# Patient Record
Sex: Female | Born: 1975 | Race: White | Hispanic: No | Marital: Single | Smoking: Never smoker
Health system: Southern US, Community
[De-identification: ages and names within clinical notes are randomized; demographics above are authoritative.]

## PROBLEM LIST (undated history)

## (undated) DIAGNOSIS — Z87442 Personal history of urinary calculi: Secondary | ICD-10-CM

## (undated) HISTORY — PX: OTHER SURGICAL HISTORY: SHX169

## (undated) HISTORY — PX: TUBAL LIGATION: SHX77

---

## 2002-12-21 ENCOUNTER — Emergency Department (HOSPITAL_COMMUNITY): Admission: EM | Admit: 2002-12-21 | Discharge: 2002-12-21 | Payer: Self-pay | Admitting: Emergency Medicine

## 2003-03-17 ENCOUNTER — Emergency Department (HOSPITAL_COMMUNITY): Admission: EM | Admit: 2003-03-17 | Discharge: 2003-03-17 | Payer: Self-pay | Admitting: Emergency Medicine

## 2004-12-21 ENCOUNTER — Emergency Department: Payer: Self-pay | Admitting: Emergency Medicine

## 2004-12-22 ENCOUNTER — Ambulatory Visit: Payer: Self-pay | Admitting: Emergency Medicine

## 2004-12-22 ENCOUNTER — Emergency Department: Payer: Self-pay | Admitting: Emergency Medicine

## 2005-07-09 ENCOUNTER — Emergency Department: Payer: Self-pay | Admitting: Emergency Medicine

## 2006-07-10 ENCOUNTER — Emergency Department: Payer: Self-pay | Admitting: Emergency Medicine

## 2006-12-18 ENCOUNTER — Observation Stay: Payer: Self-pay | Admitting: Obstetrics and Gynecology

## 2006-12-21 ENCOUNTER — Inpatient Hospital Stay: Payer: Self-pay | Admitting: Obstetrics and Gynecology

## 2007-07-31 ENCOUNTER — Emergency Department: Payer: Self-pay | Admitting: Emergency Medicine

## 2007-07-31 ENCOUNTER — Emergency Department: Payer: Self-pay

## 2007-09-21 ENCOUNTER — Ambulatory Visit: Payer: Self-pay | Admitting: Obstetrics and Gynecology

## 2007-09-24 ENCOUNTER — Ambulatory Visit: Payer: Self-pay | Admitting: Obstetrics and Gynecology

## 2009-04-30 ENCOUNTER — Emergency Department: Payer: Self-pay | Admitting: Emergency Medicine

## 2009-07-01 ENCOUNTER — Emergency Department: Payer: Self-pay | Admitting: Unknown Physician Specialty

## 2010-02-14 ENCOUNTER — Emergency Department: Payer: Self-pay | Admitting: Emergency Medicine

## 2010-09-19 ENCOUNTER — Observation Stay: Payer: Self-pay | Admitting: Obstetrics and Gynecology

## 2010-09-21 ENCOUNTER — Inpatient Hospital Stay: Payer: Self-pay | Admitting: Obstetrics and Gynecology

## 2010-09-28 LAB — PATHOLOGY REPORT

## 2011-01-12 ENCOUNTER — Emergency Department: Payer: Self-pay | Admitting: Emergency Medicine

## 2011-06-05 ENCOUNTER — Emergency Department: Payer: Self-pay | Admitting: Emergency Medicine

## 2013-03-29 IMAGING — CT CT STONE STUDY
1 of 2 series · 15 of 32 positions shown, 19 images · non-contrast
Comparison: none

REASON FOR EXAM: L flank pain
COMMENTS:

PROCEDURE:     CT  - CT ABDOMEN /PELVIS WO (STONE)  - June 05, 2011  [DATE]
RESULT:     Comparison: None
TECHNIQUE: Multiple axial images from the lung bases to the symphysis pubis
were obtained without oral and without intravenous contrast.

[Series 2: stone · axial · 0.75mm/px · z∈[-501,-108]mm · 15 of 148 slices shown, 19 images]
[im 11/148  soft-tissue]
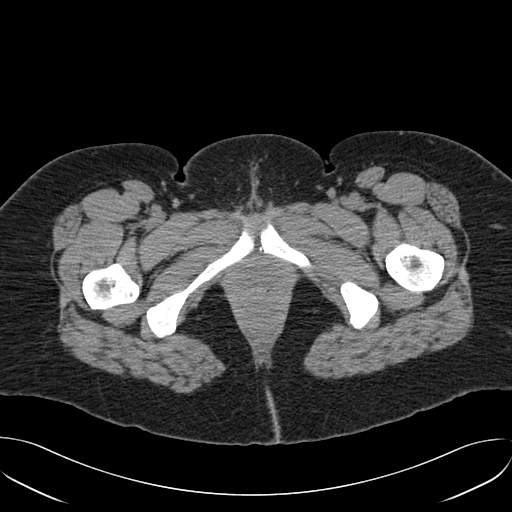
[im 11/148  bone]
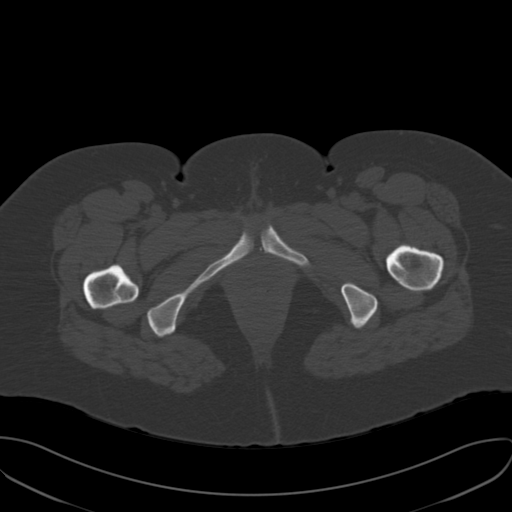
[im 22/148  soft-tissue]
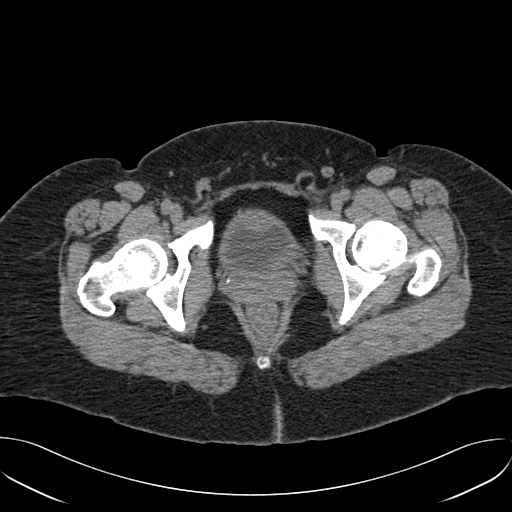
[im 33/148  soft-tissue]
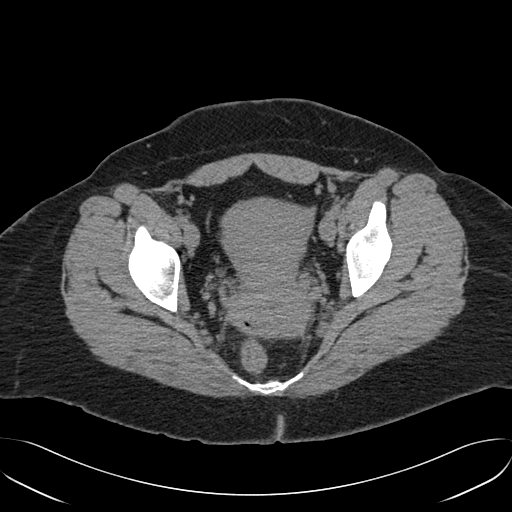
[im 44/148  soft-tissue]
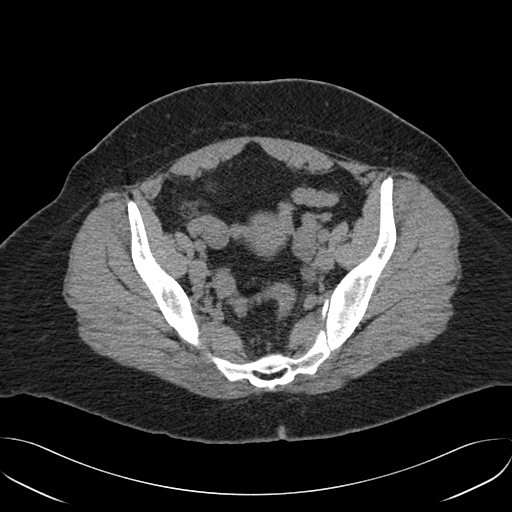
[im 55/148  soft-tissue]
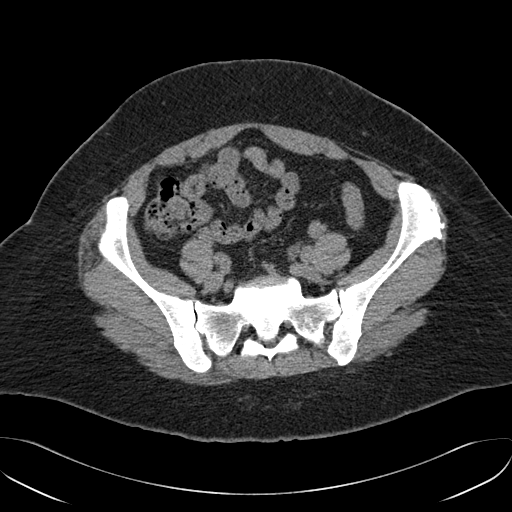
[im 66/148  soft-tissue]
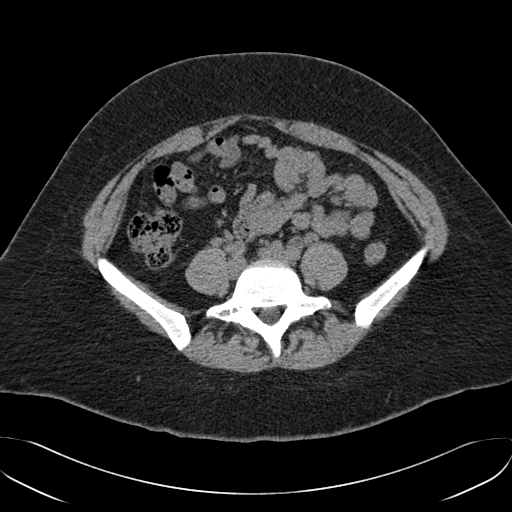
[im 77/148  soft-tissue]
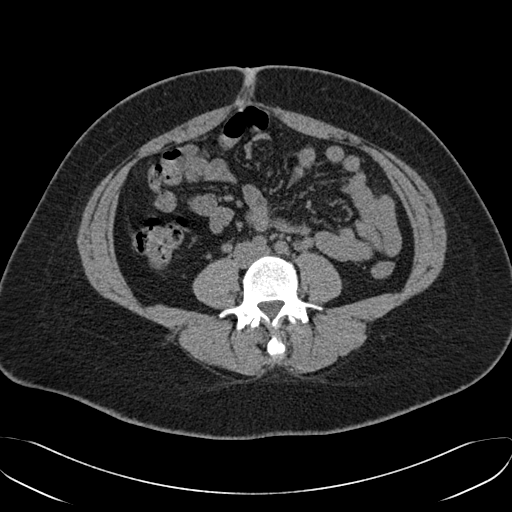
[im 88/148  soft-tissue]
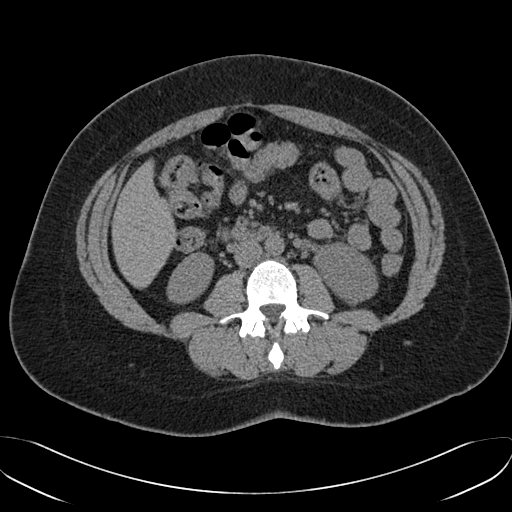
[im 99/148  soft-tissue]
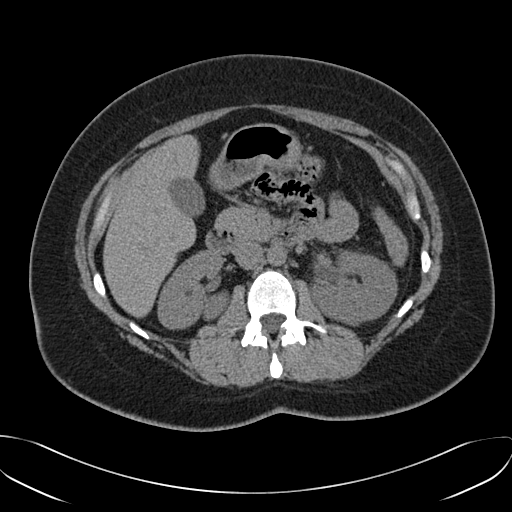
[im 99/148  bone]
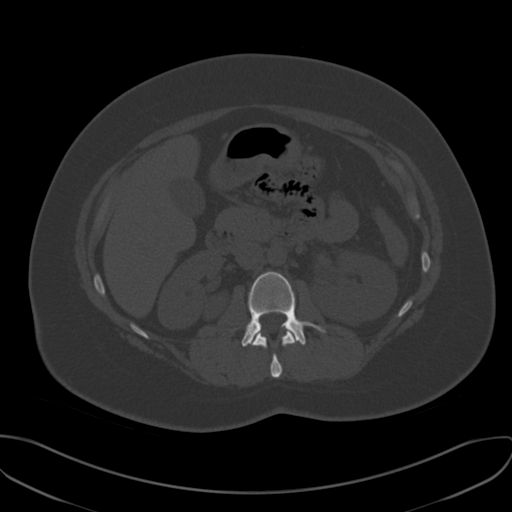
[im 109/148  soft-tissue]
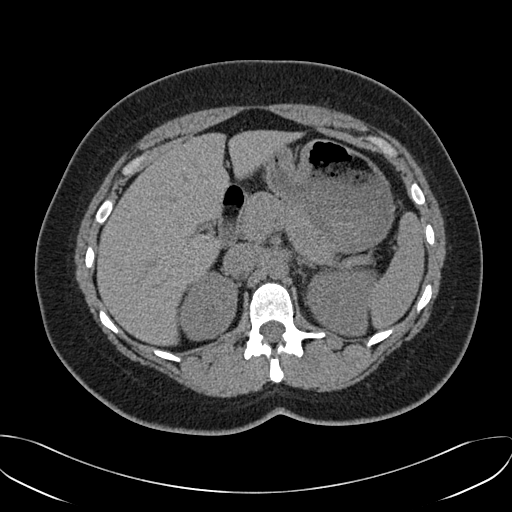
[im 120/148  soft-tissue]
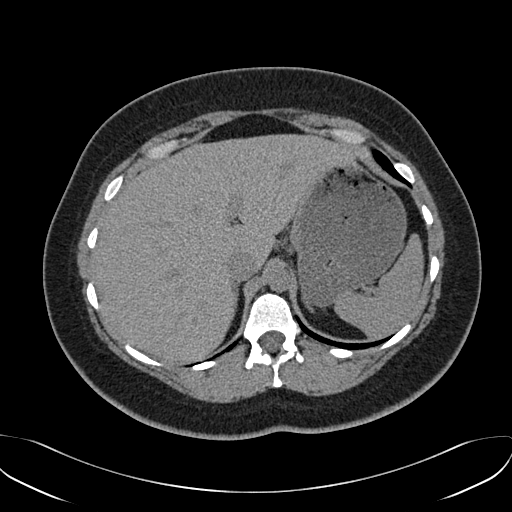
[im 126/148  lung]
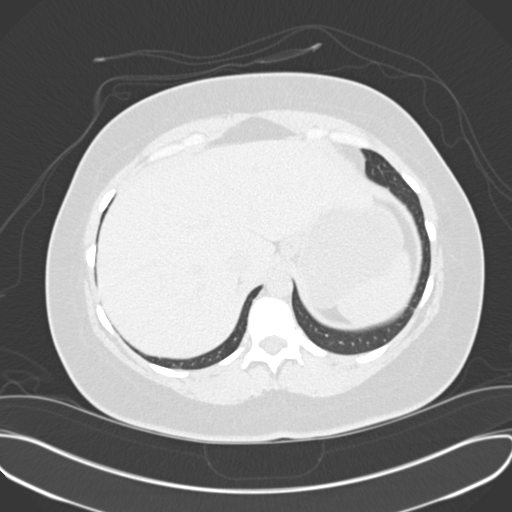
[im 131/148  soft-tissue]
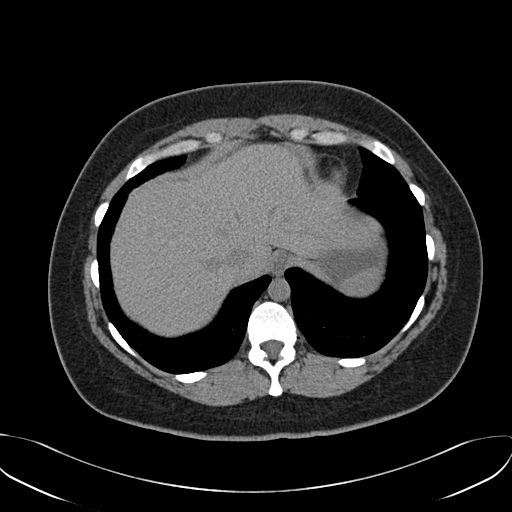
[im 131/148  lung]
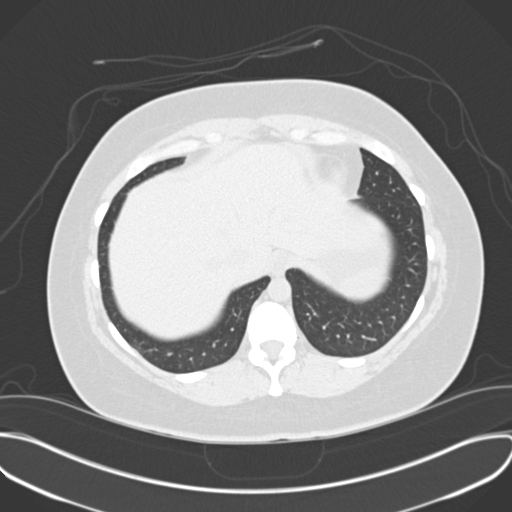
[im 137/148  lung]
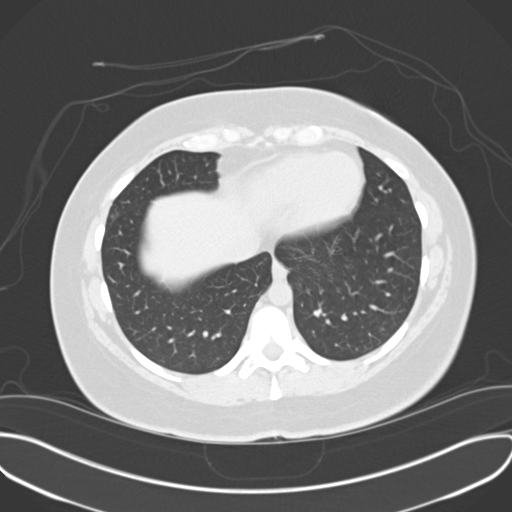
[im 142/148  soft-tissue]
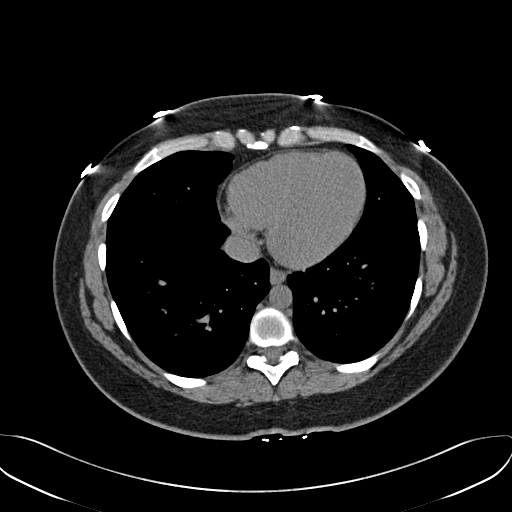
[im 142/148  lung]
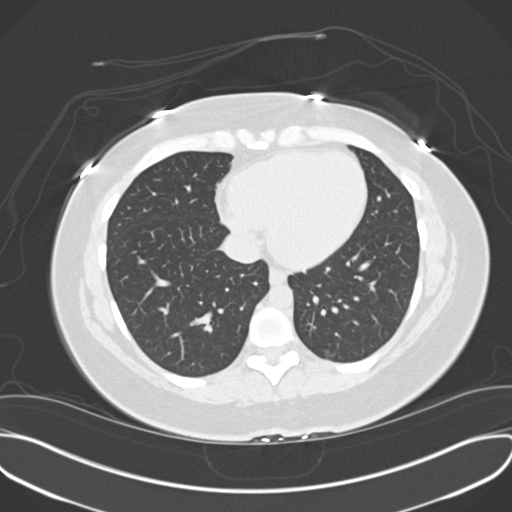

[15 of 32 positions shown; findings below may reference images not displayed]

FINDINGS: Lack of intravenous contrast limits evaluation of the solid abdominal
organs.  Grossly the liver, spleen, adrenals, gallbladder, and pancreas are
unremarkable.

There is a 2 mm calculus in the superior pole of the left kidney. There is
mild left perinephric stranding. There is mild dilatation of the left renal
collecting system and left ureter. This is seen to the level of the bladder.
There is mild thickening along the left base of the bladder which may be
related to edema from a recently passed stone. There are a few small
calcifications in the perineum inferior to the bladder, which are of
uncertain location.

The appendix is normal. The small and large bowel are normal in caliber.

No aggressive lytic or sclerotic osseous lesions identified.
IMPRESSION: 1. Mild dilatation of the left renal collecting system and left ureter
extending to the bladder. There is mild soft tissue thickening at the left
bladder base without a renal calculus. These findings may be secondary to a
recently passed stone or potentially infection. Correlate with clinical
history and urinalysis. If there is clinical concern for a mass at the
bladder base, dedicated multiphasic renal CT or direct visualization could
be performed.
2. Left-sided nephrolithiasis.

## 2016-04-02 ENCOUNTER — Emergency Department: Payer: No Typology Code available for payment source

## 2016-04-02 ENCOUNTER — Encounter: Payer: Self-pay | Admitting: Emergency Medicine

## 2016-04-02 ENCOUNTER — Emergency Department
Admission: EM | Admit: 2016-04-02 | Discharge: 2016-04-02 | Disposition: A | Discharge status: Home / Self Care | Payer: No Typology Code available for payment source | Attending: Student | Admitting: Student

## 2016-04-02 DIAGNOSIS — Y92410 Unspecified street and highway as the place of occurrence of the external cause: Secondary | ICD-10-CM | POA: Diagnosis not present

## 2016-04-02 DIAGNOSIS — S5012XA Contusion of left forearm, initial encounter: Secondary | ICD-10-CM

## 2016-04-02 DIAGNOSIS — S63502A Unspecified sprain of left wrist, initial encounter: Secondary | ICD-10-CM | POA: Diagnosis not present

## 2016-04-02 DIAGNOSIS — S46912A Strain of unspecified muscle, fascia and tendon at shoulder and upper arm level, left arm, initial encounter: Secondary | ICD-10-CM | POA: Insufficient documentation

## 2016-04-02 DIAGNOSIS — Y999 Unspecified external cause status: Secondary | ICD-10-CM | POA: Diagnosis not present

## 2016-04-02 DIAGNOSIS — Y9389 Activity, other specified: Secondary | ICD-10-CM | POA: Insufficient documentation

## 2016-04-02 DIAGNOSIS — M25512 Pain in left shoulder: Secondary | ICD-10-CM | POA: Diagnosis present

## 2016-04-02 MED ORDER — IBUPROFEN 600 MG PO TABS: 600.0000 mg | ORAL_TABLET | Freq: Three times a day (TID) | ORAL | Status: DC | PRN

## 2016-04-02 MED ORDER — OXYCODONE-ACETAMINOPHEN 7.5-325 MG PO TABS: 1.0000 | ORAL_TABLET | ORAL | Status: DC | PRN

## 2016-04-02 MED ORDER — OXYCODONE-ACETAMINOPHEN 5-325 MG PO TABS
1.0000 | ORAL_TABLET | Freq: Once | ORAL | Status: AC
  Administered 2016-04-02: 1 via ORAL
  Filled 2016-04-02: qty 1

## 2016-04-02 MED ORDER — IBUPROFEN 600 MG PO TABS
600.0000 mg | ORAL_TABLET | Freq: Once | ORAL | Status: AC
  Administered 2016-04-02: 600 mg via ORAL
  Filled 2016-04-02: qty 1
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
## 2016-04-02 NOTE — Discharge Instructions (Signed)
Wear arm sling for 2-3 days as needed. °
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
## 2018-07-18 ENCOUNTER — Encounter: Payer: Self-pay | Admitting: Emergency Medicine

## 2018-07-18 ENCOUNTER — Other Ambulatory Visit: Payer: Self-pay

## 2018-07-18 ENCOUNTER — Ambulatory Visit
Admission: EM | Admit: 2018-07-18 | Discharge: 2018-07-18 | Disposition: A | Discharge status: Home / Self Care | Payer: Worker's Compensation

## 2018-07-18 DIAGNOSIS — Z0283 Encounter for blood-alcohol and blood-drug test: Secondary | ICD-10-CM
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
## 2023-03-28 ENCOUNTER — Ambulatory Visit: Payer: Self-pay

## 2023-06-09 ENCOUNTER — Other Ambulatory Visit: Payer: Self-pay | Admitting: Orthopedic Surgery

## 2023-06-09 DIAGNOSIS — S83242A Other tear of medial meniscus, current injury, left knee, initial encounter: Secondary | ICD-10-CM

## 2023-06-12 ENCOUNTER — Other Ambulatory Visit: Payer: Self-pay | Admitting: Orthopedic Surgery

## 2023-06-12 DIAGNOSIS — S83242A Other tear of medial meniscus, current injury, left knee, initial encounter: Secondary | ICD-10-CM

## 2023-06-24 ENCOUNTER — Ambulatory Visit
Admission: RE | Admit: 2023-06-24 | Discharge: 2023-06-24 | Disposition: A | Discharge status: Home / Self Care | Payer: Self-pay | Source: Ambulatory Visit | Attending: Orthopedic Surgery | Admitting: Orthopedic Surgery

## 2023-06-24 DIAGNOSIS — S83242A Other tear of medial meniscus, current injury, left knee, initial encounter: Secondary | ICD-10-CM

## 2023-07-10 ENCOUNTER — Other Ambulatory Visit
Admission: RE | Admit: 2023-07-10 | Discharge: 2023-07-10 | Disposition: A | Discharge status: Home / Self Care | Payer: 59 | Source: Ambulatory Visit | Attending: Sports Medicine | Admitting: Sports Medicine

## 2023-07-10 DIAGNOSIS — M25462 Effusion, left knee: Secondary | ICD-10-CM | POA: Insufficient documentation

## 2023-07-10 LAB — SYNOVIAL CELL COUNT + DIFF, W/ CRYSTALS
Crystals, Fluid: NONE SEEN
Eosinophils-Synovial: 0 %
Lymphocytes-Synovial Fld: 44 %
Monocyte-Macrophage-Synovial Fluid: 44 %
Neutrophil, Synovial: 12 %
WBC, Synovial: 217 /mm3 — ABNORMAL HIGH (ref 0–200)

## 2023-07-18 ENCOUNTER — Other Ambulatory Visit: Payer: Self-pay | Admitting: Orthopedic Surgery

## 2023-07-24 ENCOUNTER — Other Ambulatory Visit: Payer: Self-pay

## 2023-07-24 ENCOUNTER — Encounter
Admission: RE | Admit: 2023-07-24 | Discharge: 2023-07-24 | Disposition: A | Discharge status: Home / Self Care | Payer: 59 | Source: Ambulatory Visit | Attending: Orthopedic Surgery | Admitting: Orthopedic Surgery

## 2023-07-24 VITALS — Ht 63.0 in | Wt 240.0 lb

## 2023-07-24 DIAGNOSIS — R079 Chest pain, unspecified: Secondary | ICD-10-CM

## 2023-07-24 DIAGNOSIS — Z01818 Encounter for other preprocedural examination: Secondary | ICD-10-CM

## 2023-07-24 HISTORY — DX: Personal history of urinary calculi: Z87.442

## 2023-07-24 NOTE — Patient Instructions (Addendum)
Your procedure is scheduled on: Tuesday 08/01/23 To find out your arrival time, please call [phone] between 1PM - 3PM on:  Monday 07/31/23  Report to the Registration Desk on the 1st floor of the [facility]. Free Valet parking is available.  If your arrival time is 6:00 am, do not arrive before that time as the [facility] entrance doors do not open until 6:00 am.  REMEMBER: Instructions that are not followed completely may result in serious medical risk, up to and including death; or upon the discretion of your surgeon and anesthesiologist your surgery may need to be rescheduled.  Do not eat food after midnight the night before surgery.  No gum chewing or hard candies.  You may however, drink CLEAR liquids up to 2 hours before you are scheduled to arrive for your surgery. Do not drink anything within 2 hours of your scheduled arrival time.  Clear liquids include: - water  - apple juice without pulp - gatorade (not RED colors) - black coffee or tea (Do NOT add milk or creamers to the coffee or tea) Do NOT drink anything that is not on this list.  Type 1 and Type 2 diabetics should only drink water.  In addition, your doctor has ordered for you to drink the provided:  Ensure Pre-Surgery Clear Carbohydrate Drink  Drinking this carbohydrate drink up to two hours before surgery helps to reduce insulin resistance and improve patient outcomes. Please complete drinking 2 hours before scheduled arrival time.  One week prior to surgery: Stop Anti-inflammatories (NSAIDS) such as Advil, Aleve, Ibuprofen, Motrin, Naproxen, Naprosyn and Aspirin based products such as Excedrin, Goody's Powder, BC Powder. You may however, continue to take Tylenol if needed for pain up until the day of surgery.  Stop ANY OVER THE COUNTER supplements until after surgery. You can continue your magnesium but hold your multivitamin until after surgery.  Continue taking all prescribed medications.   TAKE ONLY  THESE MEDICATIONS THE MORNING OF SURGERY WITH A SIP OF WATER:  none  No Alcohol for 24 hours before or after surgery.  No Smoking including e-cigarettes for 24 hours before surgery.  No chewable tobacco products for at least 6 hours before surgery.  No nicotine patches on the day of surgery.  Do not use any "recreational" drugs for at least a week (preferably 2 weeks) before your surgery.  Please be advised that the combination of cocaine and anesthesia may have negative outcomes, up to and including death. If you test positive for cocaine, your surgery will be cancelled.  On the morning of surgery brush your teeth with toothpaste and water, you may rinse your mouth with mouthwash if you wish. Do not swallow any toothpaste or mouthwash.  Use CHG Soap or wipes as directed on instruction sheet.  Do not wear lotions, powders, or perfumes.   Do not shave body hair from the neck down 48 hours before surgery.  Wear comfortable clothing (specific to your surgery type) to the hospital.  Do not wear jewelry, make-up, hairpins, clips or nail polish.  For welded (permanent) jewelry: bracelets, anklets, waist bands, etc.  Please have this removed prior to surgery.  If it is not removed, there is a chance that hospital personnel will need to cut it off on the day of surgery. Contact lenses, hearing aids and dentures may not be worn into surgery.  Do not bring valuables to the hospital. [facility] is not responsible for any missing/lost belongings or valuables.  Notify your doctor if there is any change in your medical condition (cold, fever, infection).  If you are being discharged the day of surgery, you will not be allowed to drive home. You will need a responsible individual to drive you home and stay with you for 24 hours after surgery.   If you are taking public transportation, you will need to have a responsible individual with you.  If you are being admitted to the hospital  overnight, leave your suitcase in the car. After surgery it may be brought to your room.  In case of increased patient census, it may be necessary for you, the patient, to continue your postoperative care in the Same Day Surgery department.  After surgery, you can help prevent lung complications by doing breathing exercises.  Take deep breaths and cough every 1-2 hours. Your doctor may order a device called an Incentive Spirometer to help you take deep breaths. When coughing or sneezing, hold a pillow firmly against your incision with both hands. This is called "splinting." Doing this helps protect your incision. It also decreases belly discomfort.  Surgery Visitation Policy:  Patients undergoing a surgery or procedure may have two family members or support persons with them as long as the person is not COVID-19 positive or experiencing its symptoms.   Inpatient Visitation:    Visiting hours are 7 a.m. to 8 p.m. Up to four visitors are allowed at one time in a patient room. The visitors may rotate out with other people during the day. One designated support person (adult) may remain overnight.  Please call the Pre-admissions Testing Dept. at [phone] if you have any questions about these instructions.     Preparing for Surgery with CHLORHEXIDINE GLUCONATE (CHG) Soap  Chlorhexidine Gluconate (CHG) Soap  o An antiseptic cleaner that kills germs and bonds with the skin to continue killing germs even after washing  o Used for showering the night before surgery and morning of surgery  Before surgery, you can play an important role by reducing the number of germs on your skin.  CHG (Chlorhexidine gluconate) soap is an antiseptic cleanser which kills germs and bonds with the skin to continue killing germs even after washing.  Please do not use if you have an allergy to CHG or antibacterial soaps. If your skin becomes reddened/irritated stop using the CHG.  1. Shower the NIGHT BEFORE  SURGERY and the MORNING OF SURGERY with CHG soap.  2. If you choose to wash your hair, wash your hair first as usual with your normal shampoo.  3. After shampooing, rinse your hair and body thoroughly to remove the shampoo.  4. Use CHG as you would any other liquid soap. You can apply CHG directly to the skin and wash gently with a scrungie or a clean washcloth.  5. Apply the CHG soap to your body only from the neck down. Do not use on open wounds or open sores. Avoid contact with your eyes, ears, mouth, and genitals (private parts). Wash face and genitals (private parts) with your normal soap.  6. Wash thoroughly, paying special attention to the area where your surgery will be performed.  7. Thoroughly rinse your body with warm water.  8. Do not shower/wash with your normal soap after using and rinsing off the CHG soap.  9. Pat yourself dry with a clean towel.  10. Wear clean pajamas to bed the night before surgery.  12. Place clean sheets on your bed the night of your  first shower and do not sleep with pets.  13. Shower again with the CHG soap on the day of surgery prior to arriving at the hospital.  14. Do not apply any deodorants/lotions/powders.  15. Please wear clean clothes to the hospital.

## 2023-07-26 ENCOUNTER — Encounter: Payer: Self-pay | Admitting: Urgent Care

## 2023-07-26 ENCOUNTER — Encounter
Admission: RE | Admit: 2023-07-26 | Discharge: 2023-07-26 | Disposition: A | Discharge status: Home / Self Care | Payer: 59 | Source: Ambulatory Visit | Attending: Orthopedic Surgery | Admitting: Orthopedic Surgery

## 2023-07-26 DIAGNOSIS — Z01818 Encounter for other preprocedural examination: Secondary | ICD-10-CM | POA: Insufficient documentation

## 2023-07-26 DIAGNOSIS — R079 Chest pain, unspecified: Secondary | ICD-10-CM | POA: Insufficient documentation

## 2023-07-26 DIAGNOSIS — Z01810 Encounter for preprocedural cardiovascular examination: Secondary | ICD-10-CM | POA: Diagnosis present

## 2023-07-26 DIAGNOSIS — Z01812 Encounter for preprocedural laboratory examination: Secondary | ICD-10-CM | POA: Diagnosis present

## 2023-07-26 LAB — BASIC METABOLIC PANEL
Anion gap: 8 (ref 5–15)
BUN: 11 mg/dL (ref 6–20)
CO2: 24 mmol/L (ref 22–32)
Calcium: 8.3 mg/dL — ABNORMAL LOW (ref 8.9–10.3)
Chloride: 104 mmol/L (ref 98–111)
Creatinine, Ser: 0.57 mg/dL (ref 0.44–1.00)
GFR, Estimated: >60 mL/min (ref >60)
Glucose, Bld: 88 mg/dL (ref 70–99)
Potassium: 3.7 mmol/L (ref 3.5–5.1)
Sodium: 136 mmol/L (ref 135–145)

## 2023-07-26 LAB — CBC
HCT: 41 % (ref 36.0–46.0)
Hemoglobin: 13.5 g/dL (ref 12.0–15.0)
MCH: 27.8 pg (ref 26.0–34.0)
MCHC: 32.9 g/dL (ref 30.0–36.0)
MCV: 84.4 fL (ref 80.0–100.0)
Platelets: 329 10*3/uL (ref 150–400)
RBC: 4.86 MIL/uL (ref 3.87–5.11)
RDW: 13.8 % (ref 11.5–15.5)
WBC: 5.2 10*3/uL (ref 4.0–10.5)
nRBC: 0 % (ref 0.0–0.2)

## 2023-07-31 MED ORDER — LACTATED RINGERS IV SOLN: INTRAVENOUS | Status: DC

## 2023-07-31 MED ORDER — CHLORHEXIDINE GLUCONATE 0.12 % MT SOLN
15.0000 mL | Freq: Once | OROMUCOSAL | Status: AC
  Administered 2023-08-01: 15 mL via OROMUCOSAL

## 2023-07-31 MED ORDER — CEFAZOLIN SODIUM-DEXTROSE 2-4 GM/100ML-% IV SOLN
2.0000 g | INTRAVENOUS | Status: AC
  Administered 2023-08-01: 2 g via INTRAVENOUS

## 2023-07-31 MED ORDER — ORAL CARE MOUTH RINSE
15.0000 mL | Freq: Once | OROMUCOSAL | Status: AC
Start: 2023-07-31 — End: 2023-08-01

## 2023-08-01 ENCOUNTER — Ambulatory Visit
Admission: RE | Admit: 2023-08-01 | Discharge: 2023-08-01 | Disposition: A | Discharge status: Home / Self Care | Payer: 59 | Source: Ambulatory Visit | Attending: Orthopedic Surgery | Admitting: Orthopedic Surgery

## 2023-08-01 ENCOUNTER — Ambulatory Visit: Payer: 59

## 2023-08-01 ENCOUNTER — Ambulatory Visit: Payer: 59 | Admitting: Certified Registered"

## 2023-08-01 ENCOUNTER — Encounter: Payer: Self-pay | Admitting: Orthopedic Surgery

## 2023-08-01 ENCOUNTER — Encounter
Admission: RE | Disposition: A | Discharge status: Home / Self Care | Payer: Self-pay | Source: Ambulatory Visit | Attending: Orthopedic Surgery

## 2023-08-01 ENCOUNTER — Other Ambulatory Visit: Payer: Self-pay

## 2023-08-01 DIAGNOSIS — Y9341 Activity, dancing: Secondary | ICD-10-CM | POA: Insufficient documentation

## 2023-08-01 DIAGNOSIS — Z01818 Encounter for other preprocedural examination: Secondary | ICD-10-CM

## 2023-08-01 DIAGNOSIS — S83242A Other tear of medial meniscus, current injury, left knee, initial encounter: Secondary | ICD-10-CM | POA: Diagnosis present

## 2023-08-01 HISTORY — PX: CHONDROPLASTY: SHX5177

## 2023-08-01 HISTORY — PX: KNEE ARTHROSCOPY WITH MEDIAL MENISECTOMY: SHX5651

## 2023-08-01 LAB — POCT PREGNANCY, URINE: Preg Test, Ur: NEGATIVE

## 2023-08-01 SURGERY — ARTHROSCOPY, KNEE, WITH MEDIAL MENISCECTOMY
Anesthesia: General | Site: Knee | Laterality: Left

## 2023-08-01 MED ORDER — IBUPROFEN 800 MG PO TABS: 800.0000 mg | ORAL_TABLET | Freq: Three times a day (TID) | ORAL | 0 refills | Status: AC

## 2023-08-01 MED ORDER — FENTANYL CITRATE (PF) 100 MCG/2ML IJ SOLN
25.0000 ug | INTRAMUSCULAR | Status: DC | PRN
  Administered 2023-08-01: 50 ug via INTRAVENOUS
  Administered 2023-08-01: 25 ug via INTRAVENOUS
  Administered 2023-08-01: 50 ug via INTRAVENOUS
  Administered 2023-08-01: 25 ug via INTRAVENOUS

## 2023-08-01 MED ORDER — HYDROCODONE-ACETAMINOPHEN 5-325 MG PO TABS
ORAL_TABLET | ORAL | Status: AC
  Filled 2023-08-01: qty 1

## 2023-08-01 MED ORDER — BUPIVACAINE LIPOSOME 1.3 % IJ SUSP
INTRAMUSCULAR | Status: AC
  Filled 2023-08-01: qty 20

## 2023-08-01 MED ORDER — MIDAZOLAM HCL 2 MG/2ML IJ SOLN
INTRAMUSCULAR | Status: DC | PRN
  Administered 2023-08-01: 2 mg via INTRAVENOUS

## 2023-08-01 MED ORDER — MIDAZOLAM HCL 2 MG/2ML IJ SOLN
INTRAMUSCULAR | Status: AC
  Filled 2023-08-01: qty 2

## 2023-08-01 MED ORDER — HYDROCODONE-ACETAMINOPHEN 5-325 MG PO TABS
1.0000 | ORAL_TABLET | Freq: Once | ORAL | Status: AC
  Administered 2023-08-01: 1 via ORAL

## 2023-08-01 MED ORDER — ACETAMINOPHEN 10 MG/ML IV SOLN
INTRAVENOUS | Status: DC | PRN
  Administered 2023-08-01: 1000 mg via INTRAVENOUS

## 2023-08-01 MED ORDER — LIDOCAINE HCL (CARDIAC) PF 100 MG/5ML IV SOSY
PREFILLED_SYRINGE | INTRAVENOUS | Status: DC | PRN
  Administered 2023-08-01: 50 mg via INTRAVENOUS

## 2023-08-01 MED ORDER — ACETAMINOPHEN 500 MG PO TABS: 1000.0000 mg | ORAL_TABLET | Freq: Three times a day (TID) | ORAL | 2 refills | Status: AC

## 2023-08-01 MED ORDER — LIDOCAINE HCL (PF) 1 % IJ SOLN
INTRAMUSCULAR | Status: DC | PRN
  Administered 2023-08-01: 5 mL via SUBCUTANEOUS

## 2023-08-01 MED ORDER — MIDAZOLAM HCL 2 MG/2ML IJ SOLN
1.0000 mg | INTRAMUSCULAR | Status: AC | PRN
Start: 2023-08-01 — End: 2023-08-01
  Administered 2023-08-01 (×2): 1 mg via INTRAVENOUS

## 2023-08-01 MED ORDER — FENTANYL CITRATE (PF) 100 MCG/2ML IJ SOLN
INTRAMUSCULAR | Status: AC
  Filled 2023-08-01: qty 2

## 2023-08-01 MED ORDER — LACTATED RINGERS IR SOLN
Status: DC | PRN
  Administered 2023-08-01: 6000 mL

## 2023-08-01 MED ORDER — OXYCODONE HCL 5 MG PO TABS
5.0000 mg | ORAL_TABLET | Freq: Once | ORAL | Status: AC
  Administered 2023-08-01: 5 mg via ORAL

## 2023-08-01 MED ORDER — ACETAMINOPHEN 10 MG/ML IV SOLN
INTRAVENOUS | Status: AC
  Filled 2023-08-01: qty 100

## 2023-08-01 MED ORDER — BUPIVACAINE LIPOSOME 1.3 % IJ SUSP
INTRAMUSCULAR | Status: DC | PRN
  Administered 2023-08-01: 20 mL via PERINEURAL

## 2023-08-01 MED ORDER — BUPIVACAINE HCL (PF) 0.5 % IJ SOLN
INTRAMUSCULAR | Status: DC | PRN
  Administered 2023-08-01: 10 mL via PERINEURAL

## 2023-08-01 MED ORDER — LIDOCAINE HCL (PF) 1 % IJ SOLN
INTRAMUSCULAR | Status: DC | PRN
  Administered 2023-08-01: 5 mL

## 2023-08-01 MED ORDER — HYDROCODONE-ACETAMINOPHEN 5-325 MG PO TABS: 1.0000 | ORAL_TABLET | ORAL | 0 refills | Status: AC | PRN

## 2023-08-01 MED ORDER — HYDROMORPHONE HCL 1 MG/ML IJ SOLN: 0.2500 mg | INTRAMUSCULAR | Status: DC | PRN

## 2023-08-01 MED ORDER — ASPIRIN 325 MG PO TBEC: 325.0000 mg | DELAYED_RELEASE_TABLET | Freq: Every day | ORAL | 0 refills | Status: AC

## 2023-08-01 MED ORDER — CHLORHEXIDINE GLUCONATE 0.12 % MT SOLN
OROMUCOSAL | Status: AC
  Filled 2023-08-01: qty 15

## 2023-08-01 MED ORDER — FENTANYL CITRATE PF 50 MCG/ML IJ SOSY
PREFILLED_SYRINGE | INTRAMUSCULAR | Status: AC
  Filled 2023-08-01: qty 1

## 2023-08-01 MED ORDER — ONDANSETRON 4 MG PO TBDP
4.0000 mg | ORAL_TABLET | Freq: Three times a day (TID) | ORAL | 0 refills | Status: AC | PRN
Start: 2023-08-01 — End: ?

## 2023-08-01 MED ORDER — DROPERIDOL 2.5 MG/ML IJ SOLN: 0.6250 mg | Freq: Once | INTRAMUSCULAR | Status: DC | PRN

## 2023-08-01 MED ORDER — CEFAZOLIN SODIUM-DEXTROSE 2-4 GM/100ML-% IV SOLN
INTRAVENOUS | Status: AC
  Filled 2023-08-01: qty 100

## 2023-08-01 MED ORDER — OXYCODONE HCL 5 MG PO TABS
ORAL_TABLET | ORAL | Status: AC
  Filled 2023-08-01: qty 1

## 2023-08-01 MED ORDER — FENTANYL CITRATE PF 50 MCG/ML IJ SOSY
50.0000 ug | PREFILLED_SYRINGE | Freq: Once | INTRAMUSCULAR | Status: AC
  Administered 2023-08-01: 50 ug via INTRAVENOUS

## 2023-08-01 MED ORDER — LIDOCAINE HCL (PF) 1 % IJ SOLN
INTRAMUSCULAR | Status: AC
  Filled 2023-08-01: qty 5

## 2023-08-01 MED ORDER — PROPOFOL 10 MG/ML IV BOLUS
INTRAVENOUS | Status: DC | PRN
  Administered 2023-08-01: 200 mg via INTRAVENOUS

## 2023-08-01 MED ORDER — ONDANSETRON HCL 4 MG/2ML IJ SOLN
INTRAMUSCULAR | Status: DC | PRN
  Administered 2023-08-01: 4 mg via INTRAVENOUS

## 2023-08-01 MED ORDER — LIDOCAINE HCL (PF) 1 % IJ SOLN
INTRAMUSCULAR | Status: AC
  Filled 2023-08-01: qty 30

## 2023-08-01 MED ORDER — DEXAMETHASONE SODIUM PHOSPHATE 10 MG/ML IJ SOLN
INTRAMUSCULAR | Status: DC | PRN
  Administered 2023-08-01: 10 mg via INTRAVENOUS

## 2023-08-01 MED ORDER — DEXMEDETOMIDINE HCL IN NACL 80 MCG/20ML IV SOLN
INTRAVENOUS | Status: DC | PRN
  Administered 2023-08-01 (×2): 8 ug via INTRAVENOUS
  Administered 2023-08-01: 4 ug via INTRAVENOUS

## 2023-08-01 MED ORDER — BUPIVACAINE HCL (PF) 0.5 % IJ SOLN
INTRAMUSCULAR | Status: AC
  Filled 2023-08-01: qty 10

## 2023-08-01 MED ORDER — PROPOFOL 10 MG/ML IV BOLUS
INTRAVENOUS | Status: AC
  Filled 2023-08-01: qty 20

## 2023-08-01 MED ORDER — FENTANYL CITRATE (PF) 100 MCG/2ML IJ SOLN
INTRAMUSCULAR | Status: DC | PRN
  Administered 2023-08-01 (×2): 25 ug via INTRAVENOUS
  Administered 2023-08-01: 50 ug via INTRAVENOUS

## 2023-08-01 SURGICAL SUPPLY — 50 items
ADPR IRR PORT MULTIBAG TUBE (MISCELLANEOUS)
ANCH SUT 2 FBRTK KNTLS 1.8 (Anchor) ×1 IMPLANT
ANCHOR SUT 1.8 FIBERTAK SB KL (Anchor) IMPLANT
APL PRP STRL LF DISP 70% ISPRP (MISCELLANEOUS) ×1
BLADE FULL RADIUS 3.5 (BLADE) ×1 IMPLANT
BLADE SURG SZ11 CARB STEEL (BLADE) ×1 IMPLANT
BNDG CMPR 5X4 CHSV STRCH STRL (GAUZE/BANDAGES/DRESSINGS) ×1
BNDG COHESIVE 4X5 TAN STRL LF (GAUZE/BANDAGES/DRESSINGS) ×1 IMPLANT
BNDG ESMARK 6X12 TAN STRL LF (GAUZE/BANDAGES/DRESSINGS) ×1 IMPLANT
CHLORAPREP W/TINT 26 (MISCELLANEOUS) ×1 IMPLANT
COOLER POLAR GLACIER W/PUMP (MISCELLANEOUS) ×1 IMPLANT
COVER LIGHT HANDLE UNIVERSAL (MISCELLANEOUS) ×2 IMPLANT
CUFF TOURN SGL QUICK 30 (TOURNIQUET CUFF)
CUFF TOURN SGL QUICK 34 (TOURNIQUET CUFF) ×1
CUFF TRNQT CYL 30X4X21-28X (TOURNIQUET CUFF) IMPLANT
CUFF TRNQT CYL 34X4X40X1 (TOURNIQUET CUFF) IMPLANT
DRAPE EXTREMITY T 121X128X90 (DISPOSABLE) ×1 IMPLANT
DRAPE IMP U-DRAPE 54X76 (DRAPES) ×1 IMPLANT
GAUZE SPONGE 4X4 12PLY STRL (GAUZE/BANDAGES/DRESSINGS) ×1 IMPLANT
GLOVE SRG 8 PF TXTR STRL LF DI (GLOVE) ×1 IMPLANT
GLOVE SURG ENC MOIS LTX SZ7.5 (GLOVE) ×1 IMPLANT
GLOVE SURG UNDER POLY LF SZ8 (GLOVE) ×1
GOWN STRL REUS W/ TWL LRG LVL3 (GOWN DISPOSABLE) ×1 IMPLANT
GOWN STRL REUS W/TWL LRG LVL3 (GOWN DISPOSABLE) ×1
IV LR IRRIG 3000ML ARTHROMATIC (IV SOLUTION) ×2 IMPLANT
KIT CVD SPEAR FBRTK 1.8 DRILL (KITS) IMPLANT
KIT ROOT REPAIR MEINISCAL PEEK (Anchor) IMPLANT
KIT TURNOVER KIT A (KITS) ×1 IMPLANT
MANIFOLD NEPTUNE II (INSTRUMENTS) ×1 IMPLANT
MAT ABSORB FLUID 56X50 GRAY (MISCELLANEOUS) ×1 IMPLANT
MEINISCAL ROOT REPAIR KIT PEEK (Anchor) ×1 IMPLANT
NDL SUT 2-0 SCORPION KNEE (NEEDLE) IMPLANT
NEEDLE SUT 2-0 SCORPION KNEE (NEEDLE) ×1
PACK ARTHROSCOPY KNEE (MISCELLANEOUS) ×1 IMPLANT
PAD ABD DERMACEA PRESS 5X9 (GAUZE/BANDAGES/DRESSINGS) ×1 IMPLANT
PAD WRAPON POLAR KNEE (MISCELLANEOUS) ×1 IMPLANT
PENCIL SMOKE EVACUATOR (MISCELLANEOUS) IMPLANT
SET Y ADAPTER MULIT-BAG IRRIG (MISCELLANEOUS) IMPLANT
SUT ETHILON 3-0 FS-10 30 BLK (SUTURE) ×1
SUT MNCRL 4-0 (SUTURE) ×1
SUT MNCRL 4-0 27XMFL (SUTURE) ×1
SUT VIC AB 3-0 SH 27 (SUTURE) ×1
SUT VIC AB 3-0 SH 27X BRD (SUTURE) IMPLANT
SUTURE EHLN 3-0 FS-10 30 BLK (SUTURE) ×1 IMPLANT
SUTURE MNCRL 4-0 27XMF (SUTURE) IMPLANT
TOWEL OR 17X26 4PK STRL BLUE (TOWEL DISPOSABLE) ×2 IMPLANT
TUBING INFLOW SET DBFLO PUMP (TUBING) ×1 IMPLANT
TUBING OUTFLOW SET DBLFO PUMP (TUBING) ×1 IMPLANT
WAND WEREWOLF FLOW 90D (MISCELLANEOUS) ×1 IMPLANT
WRAPON POLAR PAD KNEE (MISCELLANEOUS) ×1

## 2023-08-01 NOTE — Discharge Instructions (Addendum)
Arthroscopic Knee Surgery - Meniscus Repair   Post-Op Instructions   1. Bracing or crutches: Crutches will be provided at the time of discharge from the surgery center. Keep brace locked in extension at all times except as directed by physical therapy.    2. Ice: You may be provided with a device [facility]) that allows you to ice the affected area effectively. Otherwise you can ice manually.    3. Driving:  Plan on not driving for at least four weeks. Please note that you are advised NOT to drive while taking narcotic pain medications as you may be impaired and unsafe to drive.   4. Activity: Ankle pumps several times an hour while awake to prevent blood clots. Weight bearing: NO WEIGHT BEARING FOR 4 WEEKS. Use crutches for at least 4 weeks, if not 6 based on your surgery. Bending and straightening the knee is unlimited, but do not flex your knee past 90 degrees until cleared by your therapist. Elevate knee above heart level as much as possible for one week. Avoid standing more than 5 minutes (consecutively) for the first week. No exercise involving the knee until cleared by the surgeon or physical therapist.  Avoid long distance travel for 4 weeks.   5. Medications:  - You have been provided a prescription for narcotic pain medicine. After surgery, take 1-2 narcotic tablets every 4 hours if needed for severe pain. If it has tylenol (acetaminophen), please do not take a total of more than 3000mg /day of tylenol.  - A prescription for anti-nausea medication will be provided in case the narcotic medicine causes nausea - take 1 tablet every 6 hours only if nauseated.  - Take ibuprofen 800 mg every 8 hours with food to reduce post-operative knee swelling. DO NOT STOP IBUPROFEN POST-OP UNTIL INSTRUCTED TO DO SO at first post-op office visit (10-14 days after surgery).  - Take enteric coated aspirin 325 mg once daily for 4 weeks to prevent blood clots.  -Take tylenol 1000 every 8 hours for pain.  May  stop tylenol 3 days after surgery or when you are having minimal pain. If your narcotic has tylenol (acetaminophen), please do not take a total of more than 3000mg /day of tylenol.    If you are taking prescription medication for anxiety, depression, insomnia, muscle spasm, chronic pain, or for attention deficit disorder you are advised that you are at a higher risk of adverse effects with use of narcotics post-op, including narcotic addiction/dependence, depressed breathing, death. If you use non-prescribed substances: alcohol, marijuana, cocaine, heroin, methamphetamines, etc., you are at a higher risk of adverse effects with use of narcotics post-op, including narcotic addiction/dependence, depressed breathing, death. You are advised that taking > 50 morphine milligram equivalents (MME) of narcotic pain medication per day results in twice the risk of overdose or death. For your prescription provided: oxycodone 5 mg - taking more than 6 tablets per day. Be advised that we will prescribe narcotics short-term, for acute post-operative pain only - 1 week for minor operations such as knee arthroscopy for meniscus tear resection, and 3 weeks for major operations such as knee repair/reconstruction surgeries.   6. Bandages: The physical therapist should change the bandages at the first post-op appointment. If needed, the dressing supplies have been provided to you. You may shower after this with waterproof bandaids covering the incisions.    7. Physical Therapy: 2 times per week for the first 4 weeks, then 1-2 times per week from weeks 4-8 post-op. Therapy typically  starts on post operative Day 3 or 4. You have been provided an order for physical therapy. The therapist will provide home exercises.   8. Work/School: May do light duty/desk job/return to school in approximately 1-2 weeks when off of narcotics, pain is well-controlled, and swelling has decreased. More labor intensive jobs may require anywhere from 6  weeks to 3-5 months to fully return. Please discuss with Dr. Allena Katz, if you have not already.   9. Post-Op Appointments: Your first post-op appointment will be with Dr. Allena Katz in approximately 2 weeks time.    If you find that they have not been scheduled please call the Orthopaedic Appointment front desk at [phone].    POLAR CARE INFORMATION  MassAdvertisement.it  How to use [employer] Polar Care [facility] Therapy System?  YouTube   ShippingScam.co.uk  OPERATING INSTRUCTIONS  Start the product With dry hands, connect the transformer to the electrical connection located on the top of the cooler. Next, plug the transformer into an appropriate electrical outlet. The unit will automatically start running at this point.  To stop the pump, disconnect electrical power.  Unplug to stop the product when not in use. Unplugging the Polar Care unit turns it off. Always unplug immediately after use. Never leave it plugged in while unattended. Remove pad.    FIRST ADD WATER TO FILL LINE, THEN ICE---Replace ice when existing ice is almost melted  1 Discuss Treatment with your Licensed Health Care Practitioner and Use Only as Prescribed 2 Apply Insulation Barrier & Cold Therapy Pad 3 Check for Moisture 4 Inspect Skin Regularly  Tips and Trouble Shooting Usage Tips 1. Use cubed or chunked ice for optimal performance. 2. It is recommended to drain the Pad between uses. To drain the pad, hold the Pad upright with the hose pointed toward the ground. Depress the black plunger and allow water to drain out. 3. You may disconnect the Pad from the unit without removing the pad from the affected area by depressing the silver tabs on the hose coupling and gently pulling the hoses apart. The Pad and unit will seal itself and will not leak. Note: Some dripping during release is normal. 4. DO NOT RUN PUMP WITHOUT WATER! The pump in this unit is designed to run with water. Running the unit  without water will cause permanent damage to the pump. 5. Unplug unit before removing lid.  TROUBLESHOOTING GUIDE Pump not running, Water not flowing to the pad, Pad is not getting cold 1. Make sure the transformer is plugged into the wall outlet. 2. Confirm that the ice and water are filled to the indicated levels. 3. Make sure there are no kinks in the pad. 4. Gently pull on the blue tube to make sure the tube/pad junction is straight. 5. Remove the pad from the treatment site and ll it while the pad is lying at; then reapply. 6. Confirm that the pad couplings are securely attached to the unit. Listen for the double clicks (Figure 1) to confirm the pad couplings are securely attached.  Leaks    Note: Some condensation on the lines, controller, and pads is unavoidable, especially in warmer climates. 1. If using a [employer] Polar Care Cold Therapy unit with a detachable Cold Therapy Pad, and a leak exists (other than condensation on the lines) disconnect the pad couplings. Make sure the silver tabs on the couplings are depressed before reconnecting the pad to the pump hose; then confirm both sides of the coupling are properly clicked in.  2. If the coupling continues to leak or a leak is detected in the pad itself, stop using it and call [employer] Customer Care at [phone].  Cleaning After use, empty and dry the unit with a soft cloth. Warm water and mild detergent may be used occasionally to clean the pump and tubes.  WARNING: The Polar Care Cube can be cold enough to cause serious injury, including full skin necrosis. Follow these Operating Instructions, and carefully read the Product Insert (see pouch on side of unit) and the Cold Therapy Pad Fitting Instructions (provided with each Cold Therapy Pad) prior to use.

## 2023-08-01 NOTE — Anesthesia Preprocedure Evaluation (Signed)
Anesthesia Evaluation  Patient identified by MRN, date of birth, ID band Patient awake    Reviewed: Allergy & Precautions, H&P , NPO status , Patient's Chart, lab work & pertinent test results, reviewed documented beta blocker date and time   History of Anesthesia Complications Negative for: history of anesthetic complications  Airway Mallampati: I  TM Distance: >3 FB Neck ROM: full    Dental  (+) Teeth Intact, Dental Advidsory Given, Missing Braces:   Pulmonary neg pulmonary ROS, Continuous Positive Airway Pressure Ventilation    Pulmonary exam normal breath sounds clear to auscultation       Cardiovascular Exercise Tolerance: Good negative cardio ROS Normal cardiovascular exam Rhythm:regular Rate:Normal     Neuro/Psych negative neurological ROS  negative psych ROS   GI/Hepatic Neg liver ROS,GERD  ,,  Endo/Other  neg diabetes  Morbid obesity  Renal/GU Renal disease (kidney stones)  negative genitourinary   Musculoskeletal   Abdominal   Peds  Hematology negative hematology ROS (+)   Anesthesia Other Findings Past Medical History: No date: History of kidney stones   Reproductive/Obstetrics negative OB ROS                             Anesthesia Physical Anesthesia Plan  ASA: 3  Anesthesia Plan: General   Post-op Pain Management: Regional block*   Induction: Intravenous  PONV Risk Score and Plan: 3 and Ondansetron, Dexamethasone, Midazolam and Treatment may vary due to age or medical condition  Airway Management Planned: Oral ETT and LMA  Additional Equipment:   Intra-op Plan:   Post-operative Plan: Extubation in OR  Informed Consent: I have reviewed the patients History and Physical, chart, labs and discussed the procedure including the risks, benefits and alternatives for the proposed anesthesia with the patient or authorized representative who has indicated his/her  understanding and acceptance.     Dental Advisory Given  Plan Discussed with: Anesthesiologist, CRNA and Surgeon  Anesthesia Plan Comments:        Anesthesia Quick Evaluation

## 2023-08-01 NOTE — Anesthesia Procedure Notes (Signed)
Anesthesia Regional Block: Adductor canal block   Pre-Anesthetic Checklist: , timeout performed,  Correct Patient, Correct Site, Correct Laterality,  Correct Procedure, Correct Position, site marked,  Risks and benefits discussed,  Surgical consent,  Pre-op evaluation,  At surgeon's request and post-op pain management  Laterality: Left and Lower  Prep: chloraprep       Needles:  Injection technique: Single-shot  Needle Type: Echogenic Needle     Needle Length: 9cm  Needle Gauge: 21     Additional Needles:   Procedures:,,,, ultrasound used (permanent image in chart),,    Narrative:  Start time: 08/01/2023 10:55 AM End time: 08/01/2023 11:01 AM Injection made incrementally with aspirations every 5 mL.  Performed by: Personally  Anesthesiologist: Lenard Simmer, MD

## 2023-08-01 NOTE — Transfer of Care (Signed)
Immediate Anesthesia Transfer of Care Note  Patient: Emily Tate  Procedure(s) Performed: Left arthroscopic  medial meniscus root repair, chondroplasty (Left: Knee) Left arthroscopic medial meniscus root repair, chondroplasty (Left: Knee)  Patient Location: PACU  Anesthesia Type:General  Level of Consciousness: awake  Airway & Oxygen Therapy: Patient Spontanous Breathing  Post-op Assessment: Report given to RN and Post -op Vital signs reviewed and stable  Post vital signs: Reviewed and stable  Last Vitals:  Vitals Value Taken Time  BP 100/86 08/01/23 1305  Temp    Pulse 67 08/01/23 1308  Resp 15 08/01/23 1308  SpO2 95 % 08/01/23 1308  Vitals shown include unfiled device data.  Last Pain:  Vitals:   08/01/23 0953  TempSrc: Temporal  PainSc: 6          Complications: No notable events documented.

## 2023-08-01 NOTE — Anesthesia Procedure Notes (Signed)
Procedure Name: LMA Insertion Date/Time: 08/01/2023 11:20 AM  Performed by: Cheral Bay, CRNAPre-anesthesia Checklist: Patient identified, Emergency Drugs available, Suction available and Patient being monitored Patient Re-evaluated:Patient Re-evaluated prior to induction Oxygen Delivery Method: Circle system utilized Preoxygenation: Pre-oxygenation with 100% oxygen Induction Type: IV induction Ventilation: Mask ventilation without difficulty LMA: LMA inserted LMA Size: 4.0 Tube type: Oral Number of attempts: 1 Placement Confirmation: positive ETCO2 and breath sounds checked- equal and bilateral Tube secured with: Tape Dental Injury: Teeth and Oropharynx as per pre-operative assessment

## 2023-08-01 NOTE — Op Note (Signed)
DATE: 08/01/2023   PRE-OP DIAGNOSIS:  1. Left medial meniscus root tear   POST-OP DIAGNOSIS:  1. Left medial meniscus root tear  PROCEDURES:  1. Left knee medial meniscus root repair  2. Left knee meniscus centralization procedure 3. Left knee arthroscopic chondroplasty of patellofemoral and medial compartments   SURGEON:  Novella Olive, MD   ASSISTANT(S):  Dedra Skeens, [place]; Ahmed Prima, PA-S    ANESTHESIA: regional adductor canal block + general   TOTAL IV FLUIDS: See anesthesia record   ESTIMATED BLOOD LOSS:  5cc   TOURNIQUET TIME:  60 min   DRAINS:  None.   SPECIMENS: None   IMPLANTS:  - Arthrex Biocomposite SwiveLock (x1) - Arthrex Knotless Fibertak anchor (x1     COMPLICATIONS: None   INDICATIONS: Emily Tate is a 47 y.o. female with knee pain that has failed non-operative management. Clinical exam and radiographic studies were notable for left knee pain and swelling with instances of giving way with associated popping and catching on the medial aspect of her knee. Additionally, MRI showed a medial meniscus root tear with mild degenerative changes to medial and patellofemoral compartments. Given the poor long-term prognosis of a meniscus root tear and high likelihood of significant progression of osteoarthritis, we elected to proceed with the above procedure after a discussion of the risks, benefits, and alternatives to surgery.    OPERATIVE FINDINGS:    Examination under anesthesia: A careful examination under anesthesia was performed.  Passive range of motion was: Hyperextension: 2.  Extension: 0.  Flexion: 135.  Lachman: normal. Pivot Shift: normal.  Posterior drawer: normal.  Varus stability in full extension: normal.  Varus stability in 30 degrees of flexion: normal.  Valgus stability in full extension: normal.  Valgus stability in 30 degrees of flexion: normal.   Intra-operative findings: A thorough arthroscopic examination of the knee was performed.   The findings are: 1. Suprapatellar pouch: Normal 2. Undersurface of median ridge: Grade 3 degenerative changes 3. Medial patellar facet: Grade 2-3 degenerative changes 4. Lateral patellar facet: Grade 2-3 degenerative changes 5. Trochlea: Grade 3-4 degenerative changes over central aspect of trochlear groove measuring approximately 8 x 6 mm 6. Lateral gutter/popliteus tendon: Normal 7. Hoffa's fat pad: Inflamed 8. Medial gutter/plica: Normal 9. ACL: Normal 10. PCL: Normal 11. Medial meniscus: Complete tear of the medial meniscus at the posterior root with meniscus extrusion 12. Medial compartment cartilage: areas of Grade 2-3 degenerative changes on MFC, grade 1-2 changes on tibial plateau 13. Lateral meniscus: Normal 14. Lateral compartment cartilage: Grade 1 changes to tibial plateau  DESCRIPTION OF PROCEDURE: I identified Emily Tate in the pre-operative holding area.  I marked the operative knee with my initials. I reviewed the risks and benefits of the proposed surgical intervention and the patient wished to proceed. The patient was transferred to the operative suite and placed in the supine position with all bony prominences padded.  Anesthesia was administered. Appropriate IV antibiotics were administered prior to incision. The extremity was then prepped and draped in standard fashion. A time out was performed confirming the correct extremity, correct patient, and correct procedure.   Arthroscopy portals were marked. Local anesthetic was injected to the planned portal sites. The anterolateral portal was established with an 11 blade. The arthroscope was placed in the anterolateral portal and then into the suprapatellar pouch.  A diagnostic knee scope was completed with the above findings. Next the medial portal was established under needle localization. The MCL was pie-crusted to improve  visualization of the posterior horn. The meniscus root tear was identified and probed to confirm  our findings.  An elevator was used to free up the meniscotibial attachments to allow for improved mobilization of the meniscus.  An accessory anteromedial portal was made under needle localization.  This was placed approximately 2 cm proximal to the anteromedial portal and just anterior to the MCL.  An Arthrex knotless FiberTak curved guide was utilized to place an anchor at the most medial medial aspect of the tibial plateau.  Camera was transferred to the anteromedial portal.  Repair suture was taken out of the anterolateral portal and passed at the meniscocapsular junction at the level of the meniscus body using a Knee Scorpion device.  Next, a FiberLink stitch was passed just anterior to the previously passed repair stitch.  The repair stitch was shuttled through the meniscus using a link stitch such that there was a mattress stitch at the level of the meniscus body.  Next, the repair stitch was shuttled through the anchor and tensioned appropriately.  This nicely centralized the meniscus.   Next, an Arthrex meniscus root aiming guide was used to mark out the tibial incision. An approximately 5cm vertical incision was made medial to the tibial tubercle. This was carried down to the sartorius fascia with bovie electrocautery, and the fascia was incised. An elevator was used to clear periosteum from the tibia in the area of the anticipated bone tunnel. Hemostasis was achieved. The guide was reinserted into the tibia and was placed over the anatomic footprint of the medial meniscus root. We then used a 6.6mm FlipCutter to drill into the tibia and create a 6mm socket for the meniscus root. A FiberStick was passed through our tibial tunnel and into the joint. This was retrieved and passed through the anterolateral portal.   Next, using a Meniscus Scorpion, an 0-FiberLink suture was passed just medial to the meniscus root in a luggage tag configuration.  A second stitch was passed in a similar fashion medial to  the first stitch.  This allowed for excellent purchase of the meniscus root.  We ensured there was no soft tissue bridge between the sutures and pulled the passing stitch from the FiberStick through the anteromedial portal. We then used the passing stitch to bring the sutures in the meniscus out through the tibial tunnel. These were then passed through an [employer] anchor. Anchor hole was drilled ~3cm distal previously drilled tibial tunnel. Anchor was inserted with appropriate tension while visualizing the repair with the arthroscope, and this achieved excellent interference fit. The meniscus root was probed and found to be stable.   A gentle chondroplasty of the medial compartment and patellofemoral compartment was performed using an oscillating shaver subsequently stable cartilaginous edges.  Any loose bony debris was removed from the knee joint with a shaver, and excess fluid was evacuated from the joint. Closure of the portals with 3-0 Nylon was performed. The subdermal layer of the tibial incision was closed with 2-0 vicryl and the skin was closed with 4-0 Monocryl in a running fashion and Dermabond.  Xeroform gauze and dry sterile dressings were applied. A PolarCare and hinged knee brace were also applied.   Instrument, sponge, and needle counts were correct prior to wound closure and at the conclusion of the case.   Of note, assistance from a PA was essential to performing the surgery.  PA was present for the entire surgery.  PA assisted with patient positioning, retraction, instrumentation, and wound closure.  The surgery would have been more difficult and had longer operative time without PA assistance.   Additionally, this case had increased complexity compared to standard meniscus repair given that the patient had a complete tear of the meniscus root. Repair of this tear involved making a separate open incision, drilling a tunnel through the tibia, and using an implant in the tibia for  fixation, all of which would otherwise not occur for standard meniscal repairs.  Additionally, centralization procedure was performed to better recreate normal biomechanics of the knee.  These steps increased surgical time by approximately 30 minutes.  DISPOSITION: PACU - hemodynamically stable.    POSTOPERATIVE PLAN: The patient will be discharged home today.     Non-weight bearing x 4 weeks. 50% WB from weeks 4-6. ASA for DVT ppx x 4 weeks, Narcotic medication, NSAID, and acetaminophen as discussed pre-operatively. The patient will be attending physical therapy beginning 3-4 days post-op. Physical therapy per Meniscus Root Repair Rehab Guidelines.   Patient to return to clinic 10-14 days postop for suture removal.

## 2023-08-02 ENCOUNTER — Encounter: Payer: Self-pay | Admitting: Orthopedic Surgery

## 2023-08-04 NOTE — Anesthesia Postprocedure Evaluation (Signed)
Anesthesia Post Note  Patient: Emily Tate  Procedure(s) Performed: Left arthroscopic  medial meniscus root repair, chondroplasty (Left: Knee) Left arthroscopic medial meniscus root repair, chondroplasty (Left: Knee)  Patient location during evaluation: PACU Anesthesia Type: General Level of consciousness: awake and alert Pain management: pain level controlled Vital Signs Assessment: post-procedure vital signs reviewed and stable Respiratory status: spontaneous breathing, nonlabored ventilation, respiratory function stable and patient connected to nasal cannula oxygen Cardiovascular status: blood pressure returned to baseline and stable Postop Assessment: no apparent nausea or vomiting Anesthetic complications: no   No notable events documented.   Last Vitals:  Vitals:   08/01/23 1430 08/01/23 1443  BP: 138/86 (!) 132/57  Pulse: 72 64  Resp: 16 17  Temp:  (!) 36.1 C  SpO2: 99% 99%    Last Pain:  Vitals:   08/02/23 0846  TempSrc:   PainSc: 0-No pain                 Lenard Simmer

## 2023-09-21 ENCOUNTER — Encounter: Payer: Self-pay | Admitting: Family Medicine

## 2023-09-21 ENCOUNTER — Other Ambulatory Visit: Payer: Self-pay | Admitting: Family Medicine

## 2023-09-21 DIAGNOSIS — Z1231 Encounter for screening mammogram for malignant neoplasm of breast: Secondary | ICD-10-CM

## 2023-10-08 ENCOUNTER — Telehealth: Payer: BC Managed Care – PPO | Admitting: Family Medicine

## 2023-10-08 NOTE — Progress Notes (Signed)
 Pt did not show for visit [facility]

## 2024-10-03 ENCOUNTER — Encounter: Payer: Self-pay | Admitting: Emergency Medicine

## 2024-10-03 ENCOUNTER — Ambulatory Visit: Admission: EM | Admit: 2024-10-03 | Discharge: 2024-10-03 | Disposition: A | Discharge status: Home / Self Care

## 2024-10-03 DIAGNOSIS — J069 Acute upper respiratory infection, unspecified: Secondary | ICD-10-CM

## 2024-10-03 DIAGNOSIS — J029 Acute pharyngitis, unspecified: Secondary | ICD-10-CM | POA: Diagnosis not present

## 2024-10-03 LAB — POCT RAPID STREP A (OFFICE): Rapid Strep A Screen: NEGATIVE

## 2024-10-03 MED ORDER — CYCLOBENZAPRINE HCL 5 MG PO TABS: 5.0000 mg | ORAL_TABLET | Freq: Three times a day (TID) | ORAL | 0 refills | Status: AC | PRN

## 2024-10-03 NOTE — Discharge Instructions (Signed)
 Your symptoms today are most likely being caused by a virus and should steadily improve in time it can take up to 7 to 10 days before you truly start to see a turnaround however things will get better  Rapid strep test is negative  As you have already started taking amoxicillin course finish medication as directed as this will be protective to the airway and ideally keep symptoms from lingering or worsening  You may use muscle relaxant every 8 hours as needed for body aches, take Tylenol  and/or Motrin  consistently to help manage fever    For cough: honey 1/2 to 1 teaspoon (you can dilute the honey in water or another fluid).  You can also use guaifenesin and dextromethorphan for cough. You can use a humidifier for chest congestion and cough.  If you don't have a humidifier, you can sit in the bathroom with the hot shower running.      For sore throat: try warm salt water gargles, cepacol lozenges, throat spray, warm tea or water with lemon/honey, popsicles or ice, or OTC cold relief medicine for throat discomfort.   For congestion: take a daily anti-histamine like Zyrtec, Claritin, and a oral decongestant, such as pseudoephedrine.  You can also use Flonase 1-2 sprays in each nostril daily.   It is important to stay hydrated: drink plenty of fluids (water, gatorade/powerade/pedialyte, juices, or teas) to keep your throat moisturized and help further relieve irritation/discomfort.

## 2024-10-03 NOTE — ED Triage Notes (Addendum)
 Patient complains of bodyaches, fatigue, cough, and fever x 5 days. Patient has taken Tylenol ,and Advil  with mild relief.  Patient took Covid/Flu test at home results negative today.

## 2024-10-03 NOTE — ED Provider Notes (Signed)
 " Emily Tate    CSN: [id number] Arrival date & time: 10/03/24  1308      History   Chief Complaint Chief Complaint  Patient presents with   Generalized Body Aches   Fever   Fatigue   Cough    HPI Emily Tate is a 49 y.o. female.   Patient presents for evaluation of increased fatigue, fever peaking at 101, chills, nasal congestion, sore throat, productive cough and nausea without vomiting present for the 5 days.  Here states fever has been persistent occurring daily with chills and severe body aches.  Last occurrence of vomiting 1 day ago, poor oral intake.  With symptoms initially began had Emily Tate spots to the tongue which she scraped out described them as fleshy, complete e-visit that was prescribed amoxicillin, has taken 2 days of medicine and symptoms did improve but discontinued after googling her symptoms and watching YouTube videos.  Had exposure to stomach bug.  Home COVID and flu testing negative.  Past Medical History:  Diagnosis Date   History of kidney stones     There are no active problems to display for this patient.   Past Surgical History:  Procedure Laterality Date   CHONDROPLASTY Left 08/01/2023   Procedure: Left arthroscopic medial meniscus root repair, chondroplasty;  Surgeon: Tobie Priest, MD;  Location: [facility];  Service: Orthopedics;  Laterality: Left;   KNEE ARTHROSCOPY WITH MEDIAL MENISECTOMY Left 08/01/2023   Procedure: Left arthroscopic  medial meniscus root repair, chondroplasty;  Surgeon: Tobie Priest, MD;  Location: [facility];  Service: Orthopedics;  Laterality: Left;   TUBAL LIGATION     tubes tied      OB History   No obstetric history on file.      Home Medications    Prior to Admission medications  Medication Sig Start Date End Date Taking? Authorizing Provider  HYDROcodone -acetaminophen  (NORCO) 5-325 MG tablet Take 1-2 tablets by mouth every 4 (four) hours as needed for moderate pain (pain score 4-6) or severe pain  (pain score 7-10). 08/01/23   Tobie Priest, MD  losartan (COZAAR) 50 MG tablet Take 50 mg by mouth daily.    [provider]  magnesium gluconate (MAGONATE) 500 MG tablet Take 500 mg by mouth at bedtime.    [provider]  Multiple Vitamins-Minerals (MULTIVITAMIN GUMMIES WOMENS PO) Take 2 each by mouth daily.    [provider]  ondansetron  (ZOFRAN -ODT) 4 MG disintegrating tablet Take 1 tablet (4 mg total) by mouth every 8 (eight) hours as needed for nausea or vomiting. 08/01/23   Tobie Priest, MD    Family History Family History  Problem Relation Age of Onset   Heart disease Mother    Hypertension Mother    Heart disease Father     Social History Social History[1]   Allergies   Patient has no known allergies.   Review of Systems Review of Systems  Constitutional:  Positive for chills, fatigue and fever. Negative for activity change, appetite change, diaphoresis and unexpected weight change.  HENT:  Positive for congestion and sore throat. Negative for dental problem, drooling, ear discharge, ear pain, facial swelling, hearing loss, mouth sores, nosebleeds, postnasal drip, rhinorrhea, sinus pressure, sinus pain, sneezing, tinnitus, trouble swallowing and voice change.   Respiratory:  Positive for cough. Negative for apnea, choking, chest tightness, shortness of breath, wheezing and stridor.   Gastrointestinal:  Positive for nausea and vomiting. Negative for abdominal distention, abdominal pain, anal bleeding, blood in stool, constipation, diarrhea and  rectal pain.  Musculoskeletal:  Positive for myalgias. Negative for arthralgias, back pain, gait problem, joint swelling, neck pain and neck stiffness.     Physical Exam Triage Vital Signs ED Triage Vitals  Encounter Vitals Group     BP 10/03/24 1322 122/77     Girls Systolic BP Percentile --      Girls Diastolic BP Percentile --      Boys Systolic BP Percentile --      Boys Diastolic BP Percentile --       Pulse Rate 10/03/24 1322 88     Resp 10/03/24 1322 19     Temp 10/03/24 1322 98.4 F (36.9 C)     Temp Source 10/03/24 1322 Oral     SpO2 10/03/24 1322 96 %     Weight --      Height --      Head Circumference --      Peak Flow --      Pain Score 10/03/24 1325 5     Pain Loc --      Pain Education --      Exclude from Growth Chart --    No data found.  Updated Vital Signs BP 122/77 (BP Location: Right Arm)   Pulse 88   Temp 98.4 F (36.9 C) (Oral)   Resp 19   LMP 09/10/2024 (Approximate)   SpO2 96%   Visual Acuity Right Eye Distance:   Left Eye Distance:   Bilateral Distance:    Right Eye Near:   Left Eye Near:    Bilateral Near:     Physical Exam Constitutional:      Appearance: Normal appearance.  HENT:     Right Ear: Tympanic membrane, ear canal and external ear normal.     Left Ear: Tympanic membrane, ear canal and external ear normal.     Nose: Congestion present.     Mouth/Throat:     Pharynx: No posterior oropharyngeal erythema.     Tonsils: Tonsillar exudate present. 2+ on the right. 2+ on the left.  Cardiovascular:     Rate and Rhythm: Normal rate and regular rhythm.     Pulses: Normal pulses.     Heart sounds: Normal heart sounds.  Pulmonary:     Effort: Pulmonary effort is normal.     Breath sounds: Normal breath sounds.  Neurological:     Mental Status: She is alert and oriented to person, place, and time. Mental status is at baseline.      UC Treatments / Results  Labs (all labs ordered are listed, but only abnormal results are displayed) Labs Reviewed - No data to display  EKG   Radiology No results found.  Procedures Procedures (including critical care time)  Medications Ordered in UC Medications - No data to display  Initial Impression / Assessment and Plan / UC Course  I have reviewed the triage vital signs and the nursing notes.  Pertinent labs & imaging results that were available during my care of the patient were  reviewed by me and considered in my medical decision making (see chart for details).  Acute URI, sore throat  Patient is in no signs of distress nor toxic appearing.  Vital signs are stable.  Low suspicion for pneumonia, pneumothorax or bronchitis and therefore will defer imaging. Strep negative.  Advised patient to take amoxicillin as directed, prescribed Flexeril  for body aches as these are known to worsen symptoms.May use additional over-the-counter medications as needed for supportive care.  May  follow-up with urgent care as needed if symptoms persist or worsen.  Note given.   Final Clinical Impressions(s) / UC Diagnoses   Final diagnoses:  None   Discharge Instructions   None    ED Prescriptions   None    PDMP not reviewed this encounter.     [1]  Social History Tobacco Use   Smoking status: Never   Smokeless tobacco: Never  Vaping Use   Vaping status: Never Used  Substance Use Topics   Alcohol use: No   Drug use: No     Teresa Shelba SAUNDERS, NP 10/03/24 1433  "
